# Patient Record
Sex: Female | Born: 1965 | Hispanic: Yes | Marital: Married | Smoking: Never smoker
Health system: Southern US, Community
[De-identification: ages and names within clinical notes are randomized; demographics above are authoritative.]

---

## 2012-12-31 ENCOUNTER — Encounter (HOSPITAL_COMMUNITY): Payer: Self-pay | Admitting: *Deleted

## 2012-12-31 ENCOUNTER — Emergency Department (HOSPITAL_COMMUNITY)
Admission: EM | Admit: 2012-12-31 | Discharge: 2013-01-01 | Disposition: A | Discharge status: Home / Self Care | Payer: Self-pay | Attending: Emergency Medicine | Admitting: Emergency Medicine

## 2012-12-31 DIAGNOSIS — Z79899 Other long term (current) drug therapy: Secondary | ICD-10-CM | POA: Insufficient documentation

## 2012-12-31 DIAGNOSIS — K1370 Unspecified lesions of oral mucosa: Secondary | ICD-10-CM | POA: Insufficient documentation

## 2012-12-31 DIAGNOSIS — K1379 Other lesions of oral mucosa: Secondary | ICD-10-CM

## 2012-12-31 MED ORDER — HYDROCODONE-ACETAMINOPHEN 7.5-325 MG/15ML PO SOLN: 15.0000 mL | Freq: Three times a day (TID) | ORAL | Status: AC | PRN

## 2012-12-31 NOTE — ED Provider Notes (Signed)
CSN: [id number]     Arrival date & time 12/31/12  2122 History   This chart was scribed for non-physician practitioner, Antony Madura, PA-C, working with Juliet Rude. Rubin Payor, MD by Leone Payor, ED Scribe. This patient was seen in room TR07C/TR07C and the patient's care was started at 2122.   None    Chief Complaint  Patient presents with  . Mouth Lesions    The history is provided by the patient and a relative. No language interpreter was used.   HPI Comments: Virginia Cook- Alta Corning is a 47 y.o. female who presents to the Emergency Department complaining of ongoing, constant, worsening nodules to the sides of tongue that initially started over 1 month ago. Pt states the bumps are painful and are described as burning. She reports having mild bleeding when the nodules first appeared but has since resolved. Pt states she was seen for similar symptoms at a clinic on 12/27/12 and was told it was fungus related and was prescribed fluconazole and nystatin mouthwash. Pt immunizations UTD. She denies recent travel abroad or sick contacts. Pt denies fever, ear pain, ear discharge, inability to swallow, drooling, and SOB.   History reviewed. No pertinent past medical history. No past surgical history on file. No family history on file.  History  Substance Use Topics  . Smoking status: Not on file  . Smokeless tobacco: Not on file  . Alcohol Use: Not on file   OB History   Grav Para Term Preterm Abortions TAB SAB Ect Mult Living                 Review of Systems  Constitutional: Negative for fever.  HENT: Positive for mouth sores. Negative for ear pain and ear discharge.   Respiratory: Negative for shortness of breath.   All other systems reviewed and are negative.   Allergies  Review of patient's allergies indicates no known allergies.  Home Medications   Current Outpatient Rx  Name  Route  Sig  Dispense  Refill  . ferrous sulfate 325 (65 FE) MG EC tablet   Oral   Take 325 mg  by mouth daily.         . fluconazole (DIFLUCAN) 100 MG tablet   Oral   Take 100 mg by mouth daily.         Marland Kitchen HYDROcodone-acetaminophen (HYCET) 7.5-325 mg/15 ml solution   Oral   Take 15 mLs by mouth every 8 (eight) hours as needed for pain.   120 mL   0    BP 141/72  Pulse 69  Temp(Src) 98 F (36.7 C) (Oral)  Resp 20  SpO2 100%  Physical Exam  Nursing note and vitals reviewed. Constitutional: She is oriented to person, place, and time. She appears well-developed and well-nourished. No distress.  HENT:  Head: Normocephalic and atraumatic.  Right Ear: Tympanic membrane, external ear and ear canal normal. No mastoid tenderness.  Left Ear: Tympanic membrane, external ear and ear canal normal. No mastoid tenderness.  Nose: Nose normal.  Mouth/Throat: Uvula is midline and mucous membranes are normal. No trismus in the jaw. No edematous. Posterior oropharyngeal erythema (mild) present. No oropharyngeal exudate.  Slight erythema to the posterior pharynx. No exudate noted. Airway patent. Uvula midline. No oral lesions or ulcerations appreciated.  Eyes: Conjunctivae and EOM are normal. No scleral icterus.  Neck: Normal range of motion.  Cardiovascular: Normal rate, regular rhythm and normal heart sounds.   Pulmonary/Chest: Effort normal and breath sounds normal. No stridor. No  respiratory distress. She has no wheezes. She has no rales.  Musculoskeletal: Normal range of motion.  Lymphadenopathy:    She has no cervical adenopathy.  Neurological: She is alert and oriented to person, place, and time.  Skin: Skin is warm and dry. No rash noted. She is not diaphoretic. No erythema. No pallor.  Psychiatric: She has a normal mood and affect. Her behavior is normal.   ED Course  DIAGNOSTIC STUDIES: Oxygen Saturation is 100% on RA, normal by my interpretation.    COORDINATION OF CARE: 11:04 PM-Discussed treatment plan with pt at bedside and pt agreed to plan.   Procedures (including  critical care time)  Labs Reviewed  RAPID STREP SCREEN  CULTURE, GROUP A STREP   No results found.  1. Pain in mouth    MDM  Uncomplicated mouth pain - patient as well and nontoxic appearing and afebrile; hemodynamically stable. Airway patent and patient tolerating secretions without difficulty or drooling. Uvula midline without evidence of PTA. No trismus or stridor appreciated. No exudates, lesions, or ulcerations appreciated. No findings c/w candidal infection. Rapid strep negative. Patient appropriate for d/c with ENT and dental follow up for further evaluation of symptoms. Indications for ED return provided. Patient seen also by Dr. Rubin Payor who is in agreement with this work up, assessment/managment plan, and patient's stability for d/c.  I personally performed the services described in this documentation, which was scribed in my presence. The recorded information has been reviewed and is accurate.    Antony Madura, PA-C 01/01/13 0005

## 2012-12-31 NOTE — ED Notes (Signed)
Pt. Is being followed by [facility] for bumps on back of her tongue. She was given flagyl and a mouth wash and was told to come back to them if she still had trouble and they would do more testing. She was told at that time she had a fungus. She present now with pain. She has not completed he prescription yet.

## 2012-12-31 NOTE — ED Notes (Signed)
Pt in c/o bumps on her tongue for over a month, seen for same and told she had a fungus, in today due to symptoms continuing

## 2013-01-01 NOTE — ED Provider Notes (Signed)
Medical screening examination/treatment/procedure(s) were conducted as a shared visit with non-physician practitioner(s) and myself.  I personally evaluated the patient during the encounter. Patient with pain in her mouth. No clear lesions. Will discharge with followup  Virginia Cook. Virginia Payor, MD 01/01/13 1454

## 2017-03-24 ENCOUNTER — Ambulatory Visit: Payer: Self-pay

## 2017-05-12 ENCOUNTER — Ambulatory Visit: Payer: Self-pay | Attending: Oncology

## 2019-11-28 ENCOUNTER — Ambulatory Visit: Payer: Self-pay

## 2019-12-06 ENCOUNTER — Ambulatory Visit: Payer: Self-pay | Attending: Oncology | Admitting: *Deleted

## 2019-12-06 ENCOUNTER — Encounter: Payer: Self-pay | Admitting: *Deleted

## 2019-12-06 ENCOUNTER — Other Ambulatory Visit: Payer: Self-pay

## 2019-12-06 ENCOUNTER — Encounter (INDEPENDENT_AMBULATORY_CARE_PROVIDER_SITE_OTHER): Payer: Self-pay

## 2019-12-06 ENCOUNTER — Ambulatory Visit
Admission: RE | Admit: 2019-12-06 | Discharge: 2019-12-06 | Disposition: A | Discharge status: Home / Self Care | Payer: Self-pay | Source: Ambulatory Visit | Attending: Oncology | Admitting: Oncology

## 2019-12-06 VITALS — BP 138/71 | HR 63 | Temp 97.9°F | Ht 58.25 in | Wt 150.6 lb

## 2019-12-06 DIAGNOSIS — Z0000 Encounter for general adult medical examination without abnormal findings: Secondary | ICD-10-CM | POA: Insufficient documentation

## 2019-12-06 NOTE — Patient Instructions (Signed)
Gave patient hand-out, Women Staying Healthy, Active and Well from [facility], with education on breast health, pap smears, heart and colon health. 

## 2019-12-06 NOTE — Progress Notes (Signed)
  Subjective:     Patient ID: Virginia Cook, female   DOB: [birth year], 54 y.o.   MRN: [id number]  HPI  [facility] Medical History Record - 12/06/19 1020      Breast History   Screening cycle New    CBE Date --   unknown   Initial Mammogram 12/06/19    Last Mammogram Annual    Last Mammogram Date 05/21/09    Provider (Mammogram)  [facility]    Recent Breast Symptoms None      Breast Cancer History   Breast Cancer History No personal or family history      Previous History of Breast Problems   Breast Surgery or Biopsy None    Breast Implants N/A    BSE Done Monthly      Gynecological/Obstetrical History   Is there any chance that the client could be pregnant?  No    Age at menarche 36    Age at menopause 2    PAP smear history See comments    Date of last PAP  02/06/19    Provider (PAP) Bernestine Amass    Age at first live birth 74    Breast fed children Yes (type length in comments)   70yr 28mo   DES Exposure Unkown    Cervical, Uterine or Ovarian cancer No    Family history of Cervial, Uterine or Ovarian cancer No    Hysterectomy No    Cervix removed No    Ovaries removed No    Laser/Cryosurgery No    Current method of birth control None    Current method of Estrogen/Hormone replacement None    Smoking history None           Review of Systems     Objective:   Physical Exam Chest:     Breasts:        Right: No swelling, bleeding, inverted nipple, mass, nipple discharge, skin change or tenderness.        Left: No swelling, bleeding, inverted nipple, mass, nipple discharge, skin change or tenderness.  Lymphadenopathy:     Upper Body:     Right upper body: No supraclavicular or axillary adenopathy.     Left upper body: No supraclavicular or axillary adenopathy.        Assessment:     54 year old Hispanic female presents for clinical breast exam and mammogram only.  Virginia Cook, the interpreter present during the interview and exam.  Clinical breast exam  unremarkable.  Taught self breast awareness.  Last pap was in 2020 at [facility].  She did just have a negative endometrial biopsy at [facility] on 11/17/19.  Patient has been screened for eligibility.  She does not have any insurance, Medicare or Medicaid.  She also meets financial eligibility.   Risk Assessment    Risk Scores      12/06/2019   Last edited by: Alta Corning, CMA   5-year risk: 0.5 %   Lifetime risk: 4 %            Plan:     Screening mammogram ordered.  Will follow up per [facility] protocol.

## 2019-12-12 ENCOUNTER — Other Ambulatory Visit: Payer: Self-pay | Admitting: *Deleted

## 2019-12-12 DIAGNOSIS — N63 Unspecified lump in unspecified breast: Secondary | ICD-10-CM

## 2019-12-13 ENCOUNTER — Telehealth: Payer: Self-pay | Admitting: *Deleted

## 2019-12-13 ENCOUNTER — Encounter: Payer: Self-pay | Admitting: *Deleted

## 2019-12-13 NOTE — Telephone Encounter (Signed)
CALLED PT TO [facility] - NO ANSWER NO VM - SENDING 2ND RESULT LETTER.

## 2019-12-28 ENCOUNTER — Ambulatory Visit
Admission: RE | Admit: 2019-12-28 | Discharge: 2019-12-28 | Disposition: A | Discharge status: Home / Self Care | Payer: Self-pay | Source: Ambulatory Visit | Attending: Oncology | Admitting: Oncology

## 2019-12-28 ENCOUNTER — Other Ambulatory Visit: Payer: Self-pay

## 2019-12-28 DIAGNOSIS — N63 Unspecified lump in unspecified breast: Secondary | ICD-10-CM | POA: Insufficient documentation

## 2020-01-04 ENCOUNTER — Other Ambulatory Visit: Payer: Self-pay | Admitting: *Deleted

## 2020-01-04 ENCOUNTER — Encounter: Payer: Self-pay | Admitting: *Deleted

## 2020-01-04 DIAGNOSIS — N63 Unspecified lump in unspecified breast: Secondary | ICD-10-CM

## 2020-01-04 NOTE — Progress Notes (Signed)
Letter mailed to inform patient of her 6 month follow up mammogram on 06/21/20 @ 10:20.

## 2020-06-21 ENCOUNTER — Ambulatory Visit: Payer: Self-pay

## 2020-12-10 ENCOUNTER — Encounter: Payer: Self-pay | Admitting: Student

## 2020-12-10 ENCOUNTER — Encounter: Payer: Self-pay | Admitting: Family Medicine

## 2020-12-17 ENCOUNTER — Ambulatory Visit
Admission: RE | Admit: 2020-12-17 | Discharge: 2020-12-17 | Disposition: A | Discharge status: Home / Self Care | Payer: Self-pay | Source: Ambulatory Visit | Attending: Oncology | Admitting: Oncology

## 2020-12-17 ENCOUNTER — Other Ambulatory Visit: Payer: Self-pay

## 2020-12-17 ENCOUNTER — Ambulatory Visit: Payer: Self-pay | Attending: Oncology | Admitting: *Deleted

## 2020-12-17 VITALS — BP 122/61 | HR 58 | Temp 97.2°F | Ht 59.0 in | Wt 144.6 lb

## 2020-12-17 DIAGNOSIS — N63 Unspecified lump in unspecified breast: Secondary | ICD-10-CM

## 2020-12-17 NOTE — Progress Notes (Signed)
m Subjective:     Patient ID: Virginia Cook, female   DOB: 1965/12/09, 55 y.o.   MRN: [id number]  HPI  [facility] Medical History Record - 12/17/20 9937       Breast History   Screening cycle Rescreen    CBE Date 12/27/19    Provider (CBE) [facility]    Initial Mammogram 12/17/20    Last Mammogram Date 12/28/19    Provider (Mammogram)  [facility]    Recent Breast Symptoms Pain      Breast Cancer History   Breast Cancer History No personal or family history      Previous History of Breast Problems   Breast Surgery or Biopsy None    Breast Implants N/A    BSE Done Monthly      Gynecological/Obstetrical History   LMP --   5-6 years ago   Is there any chance that the client could be pregnant?  No    Age at menarche 55    Age at menopause 55    PAP smear history Annually    Date of last PAP  05/18/19    Provider (PAP) [facility]    Age at first live birth 13    Breast fed children Yes (type length in comments)   1 year   DES Exposure Unkown    Cervical, Uterine or Ovarian cancer No    Family history of Cervial, Uterine or Ovarian cancer No    Hysterectomy No    Ovaries removed No    Laser/Cryosurgery No    Current method of birth control None    Current method of Estrogen/Hormone replacement None    Smoking history None               Review of Systems     Objective:   Physical Exam Chest:  Breasts:    Right: No swelling, bleeding, inverted nipple, mass, nipple discharge, skin change, tenderness, axillary adenopathy or supraclavicular adenopathy.     Left: No swelling, bleeding, inverted nipple, mass, nipple discharge, skin change, tenderness, axillary adenopathy or supraclavicular adenopathy.  Lymphadenopathy:     Upper Body:     Right upper body: No supraclavicular or axillary adenopathy.     Left upper body: No supraclavicular or axillary adenopathy.       Assessment:     55 year old Hispanic female returns to [facility] for annual screening.   Virginia Cook the Interpreter present during the interview and exam.  Patients last mammogram on 12/28/19 was a birads 3.  Patient did not return for her 6 month follow up mammogram.  On clinical breast exam there is no dominant mass, skin changes, nipple discharge or lymphadenopathy.  Taught self breast awareness.  Last pap on 05/18/19 was negative without HPV co-testing.  Next pap due in 2024.  Patient has been screened for eligibility.  She does not have any insurance, Medicare or Medicaid.  She also meets financial eligibility.   Risk Assessment     Risk Scores       12/17/2020 12/06/2019   Last edited by: Jim Like, RN [place], Freada Bergeron, CMA   5-year risk: 0.5 % 0.5 %   Lifetime risk: 3.9 % 4 %               Plan:     Will order bilateral diagnostic mammogram and ultrasound.  Will follow up per [facility] protocol.

## 2020-12-18 ENCOUNTER — Encounter: Payer: Self-pay | Admitting: *Deleted

## 2020-12-18 NOTE — Patient Instructions (Signed)
Gave patient hand-out, Women Staying Healthy, Active and Well from [facility], with education on breast health, pap smears, heart and colon health. 

## 2020-12-18 NOTE — Progress Notes (Signed)
Letter mailed from [facility] to inform patient of her normal mammogram results.  Patient is to follow-up with annual screening in one year. 

## 2022-01-29 ENCOUNTER — Other Ambulatory Visit: Payer: Self-pay

## 2022-01-29 DIAGNOSIS — Z1231 Encounter for screening mammogram for malignant neoplasm of breast: Secondary | ICD-10-CM

## 2022-02-02 ENCOUNTER — Ambulatory Visit: Payer: Self-pay | Attending: Hematology and Oncology | Admitting: Hematology and Oncology

## 2022-02-02 ENCOUNTER — Ambulatory Visit
Admission: RE | Admit: 2022-02-02 | Discharge: 2022-02-02 | Disposition: A | Discharge status: Home / Self Care | Payer: Self-pay | Source: Ambulatory Visit | Attending: Obstetrics and Gynecology | Admitting: Obstetrics and Gynecology

## 2022-02-02 VITALS — BP 127/69 | Wt 148.4 lb

## 2022-02-02 DIAGNOSIS — Z1231 Encounter for screening mammogram for malignant neoplasm of breast: Secondary | ICD-10-CM

## 2022-02-02 NOTE — Progress Notes (Signed)
Virginia Cook is a 56 y.o. female who presents to [facility] clinic today with no complaints .    Pap Smear: Pap not smear completed today. Last Pap smear was 05/18/2019 at [facility] clinic and was normal. Per patient has no history of an abnormal Pap smear. Last Pap smear result is not available in Epic.   Physical exam: Breasts Breasts symmetrical. No skin abnormalities bilateral breasts. No nipple retraction bilateral breasts. No nipple discharge bilateral breasts. No lymphadenopathy. No lumps palpated bilateral breasts.    MS DIGITAL DIAG TOMO BILAT  Result Date: 12/17/2020 CLINICAL DATA:  Short-term follow-up left breast asymmetry EXAM: DIGITAL DIAGNOSTIC BILATERAL MAMMOGRAM WITH TOMOSYNTHESIS AND CAD TECHNIQUE: Bilateral digital diagnostic mammography and breast tomosynthesis was performed. The images were evaluated with computer-aided detection. COMPARISON:  Prior films ACR Breast Density Category b: There are scattered areas of fibroglandular density. FINDINGS: Cc and MLO views of bilateral breasts are submitted. Previously noted asymmetry in the posteromedial left breast appears to have focal notched fat and is unchanged compared prior exam. No suspicious abnormalities identified bilaterally. IMPRESSION: Benign findings. RECOMMENDATION: Routine screening mammogram in 1 year. I have discussed the findings and recommendations with the patient. If applicable, a reminder letter will be sent to the patient regarding the next appointment. BI-RADS CATEGORY  2: Benign. Electronically Signed   By: Sherian Rein M.D.   On: 12/17/2020 09:53  MS DIGITAL DIAG TOMO BILAT  Result Date: 12/28/2019 CLINICAL DATA:  56 year old patient recalled from recent baseline screening mammogram for evaluation possible mass in the left breast and a possible mass in the right breast. Spanish interpreter present throughout the exam. EXAM: DIGITAL DIAGNOSTIC BILATERAL MAMMOGRAM WITH CAD AND TOMO ULTRASOUND  BILATERAL BREASTS COMPARISON:  December 06, 2019 ACR Breast Density Category b: There are scattered areas of fibroglandular density. FINDINGS: Spot compression views confirm a 6 mm oval mass or possibly focal fibroglandular tissue in the posterior third of the medial left breast, near the level of the nipple. Spot compression views of the 6 o'clock retroareolar right breast show a 5 mm oval mass versus retroareolar duct. Mammographic images were processed with CAD. Targeted ultrasound is performed, showing mildly dilated duct versus focal cyst in the 6 o'clock retroareolar right breast with some internal debris. No associated vascular flow. Measures 6 x 4 x 3 mm and corresponds to the mammographic finding. No suspicious findings on ultrasound of the right breast. Ultrasound of the medial left breast, including lower inner and lower outer quadrants is performed by the sonographer and by myself. No suspicious findings are identified on ultrasound and no definite sonographic correlate to the possible mass identified on mammography. IMPRESSION: Probably benign mass versus asymmetry in the posterior third of the medial left breast, with no sonographic correlate. Benign retroareolar duct/cyst in the 6 o'clock axis of the right breast. No evidence of malignancy in the right. RECOMMENDATION: Diagnostic left mammogram and possible left breast ultrasound recommended in 6 months. I have discussed the findings and recommendations with the patient. If applicable, a reminder letter will be sent to the patient regarding the next appointment. BI-RADS CATEGORY  3: Probably benign. Electronically Signed   By: Britta Mccreedy M.D.   On: 12/28/2019 11:52   MS DIGITAL SCREENING TOMO BILATERAL  Result Date: 12/11/2019 CLINICAL DATA:  Screening. EXAM: DIGITAL SCREENING BILATERAL MAMMOGRAM WITH TOMO AND CAD COMPARISON:  None. ACR Breast Density Category b: There are scattered areas of fibroglandular density. FINDINGS: In the right breast a  possible mass  requires further evaluation. In the left breast a possible mass requires further evaluation. Images were processed with CAD. IMPRESSION: Further evaluation is suggested for possible mass in the right breast. Further evaluation is suggested for possible mass in the left breast. RECOMMENDATION: Diagnostic mammogram and possibly ultrasound of both breasts. (Code:[id number]) The patient will be contacted regarding the findings, and additional imaging will be scheduled. BI-RADS CATEGORY  0: Incomplete. Need additional imaging evaluation and/or prior mammograms for comparison. Electronically Signed   By: Nolon Nations M.D.   On: 12/11/2019 15:21       Pelvic/Bimanual Pap is not indicated today    Smoking History: Patient has never smoked and was not referred to quit line.    Patient Navigation: Patient education provided. Access to services provided for patient through [place] interpreter provided. No transportation provided   Colorectal Cancer Screening: Per patient has had colonoscopy completed on 09/24/2020 with results revealing fragments of tubular adenoma.   No complaints today.    Breast and Cervical Cancer Risk Assessment: Patient does not have family history of breast cancer, known genetic mutations, or radiation treatment to the chest before age 56. Patient does not have history of cervical dysplasia, immunocompromised, or DES exposure in-utero.  Risk Assessment   No risk assessment data for the current encounter  Risk Scores       12/17/2020   Last edited by: Rico Junker, RN   5-year risk: 0.5 %   Lifetime risk: 3.9 %            A: [facility] exam without pap smear No complaints and benign exam.   P: [facility]. Appointment scheduled 02/02/2022.  Dayton Scrape A, NP 02/02/2022 10:08 AM

## 2022-08-12 NOTE — Progress Notes (Signed)
Per [facility], Provider in process of reaching out to patient to have patient scheduled for CPE with pap.

## 2022-09-07 ENCOUNTER — Other Ambulatory Visit: Payer: Self-pay

## 2022-09-07 ENCOUNTER — Ambulatory Visit: Payer: Self-pay | Attending: Hematology and Oncology | Admitting: Hematology and Oncology

## 2022-09-07 DIAGNOSIS — Z124 Encounter for screening for malignant neoplasm of cervix: Secondary | ICD-10-CM

## 2022-09-07 NOTE — Progress Notes (Signed)
[facility]           Date of Birth: [birth year]           MRN: [id number] Visit Date: 09/07/2022 PCP: Rosemarie Beath, PA-C (Inactive)     Cervical Exam Pap smear completed: Pap test Abnormal Observations: Normal exam. Recommendations: Repeat Pap smear in 5 years if normal and negative HPV.    Patient's History There are no problems to display for this patient.  No past medical history on file.  Family History  Problem Relation Age of Onset  . Breast cancer Neg Hx     Social History   Occupational History  . Not on file  Tobacco Use  . Smoking status: Never  . Smokeless tobacco: Never  Vaping Use  . Vaping Use: Never used  Substance and Sexual Activity  . Alcohol use: Not Currently  . Drug use: Never  . Sexual activity: Yes    Birth control/protection: Post-menopausal

## 2022-09-09 ENCOUNTER — Telehealth: Payer: Self-pay

## 2022-09-09 LAB — CYTOLOGY - PAP
Comment: NEGATIVE
Diagnosis: NEGATIVE
High risk HPV: NEGATIVE

## 2022-09-09 NOTE — Telephone Encounter (Signed)
Via Delorise Royals, Spanish Interpreter [facility]), left message on voicemail requesting a return call. Attempted to contact patient regarding lab results.

## 2022-09-11 ENCOUNTER — Telehealth: Payer: Self-pay

## 2022-09-11 NOTE — Telephone Encounter (Signed)
Via Delorise Royals, Spanish Interpreter [facility]), Patient informed negative Pap/HPV results, next pap smear due in 5 years, verbalized understanding.

## 2023-05-24 IMAGING — MG DIGITAL DIAGNOSTIC BILAT W/ TOMO W/ CAD
8 series · 8 of 24 positions shown · non-contrast
Comparison: Prior films

CLINICAL DATA: Short-term follow-up left breast asymmetry

EXAM:
DIGITAL DIAGNOSTIC BILATERAL MAMMOGRAM WITH TOMOSYNTHESIS AND CAD
TECHNIQUE: Bilateral digital diagnostic mammography and breast tomosynthesis
was performed. The images were evaluated with computer-aided
detection.

[L CC synth-2D]
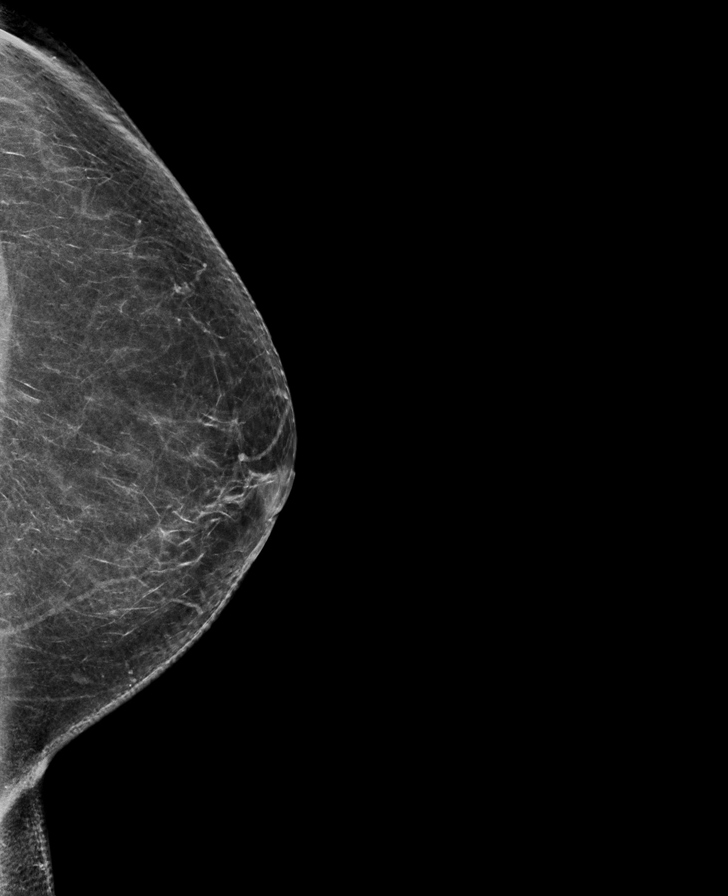

[R MLO synth-2D]
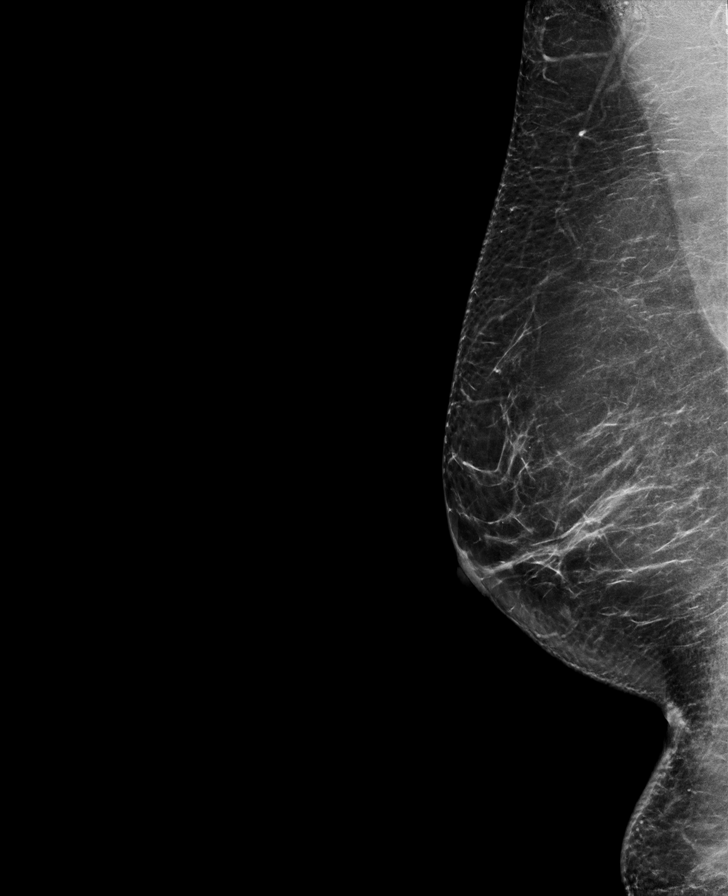

[L MLO synth-2D]
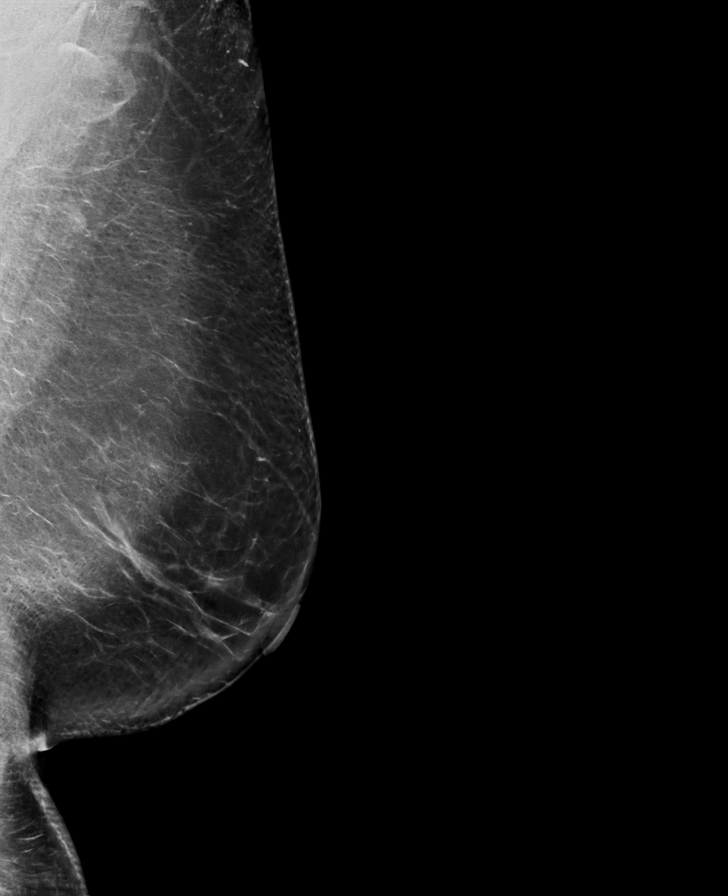

[R CC synth-2D]
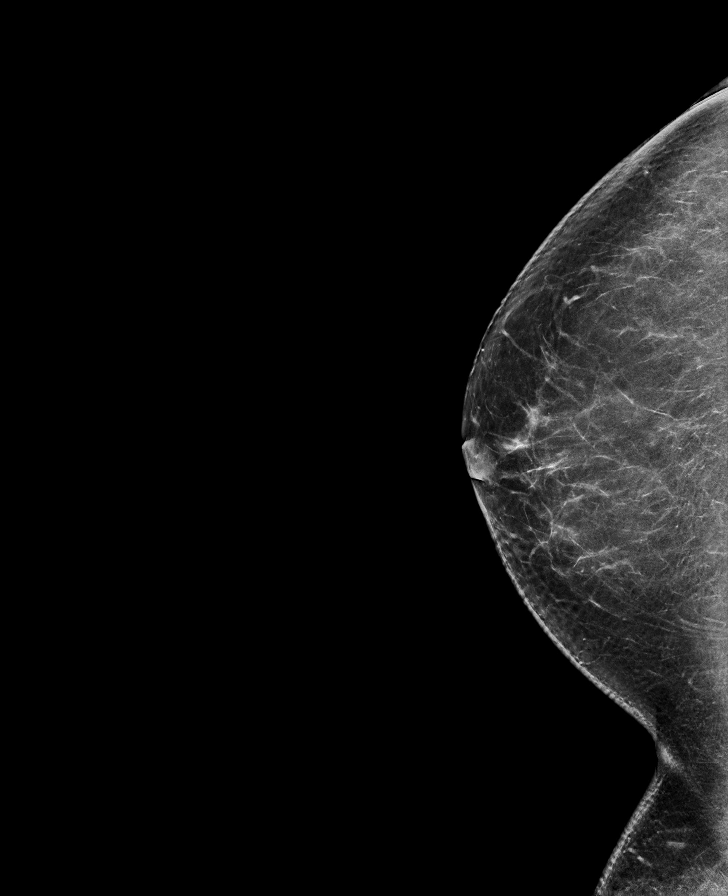

[R CC tomo · tomo slice 39/77.0]
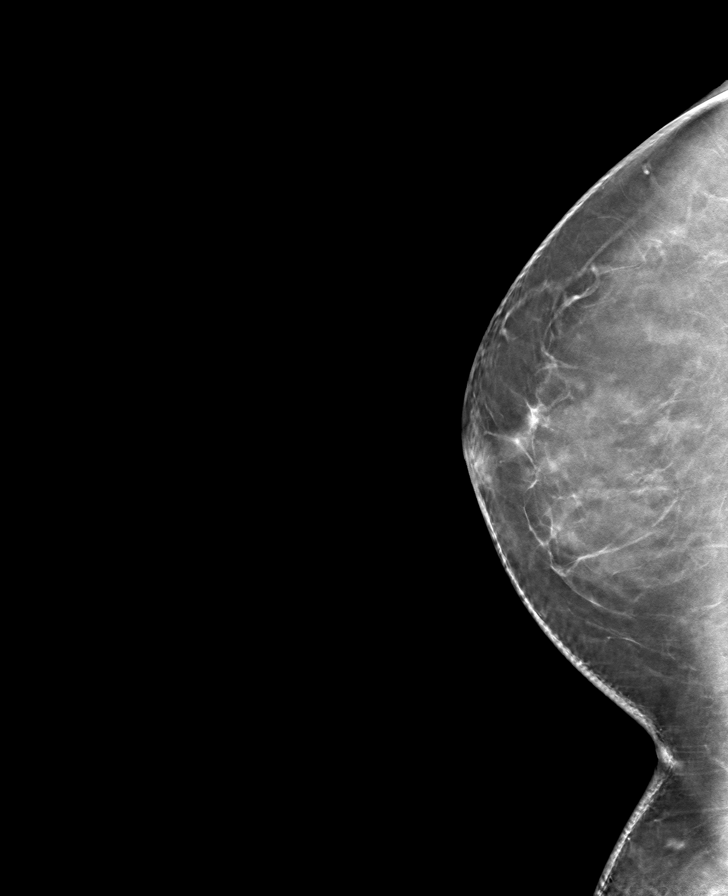

[L MLO tomo · tomo slice 46/91.0]
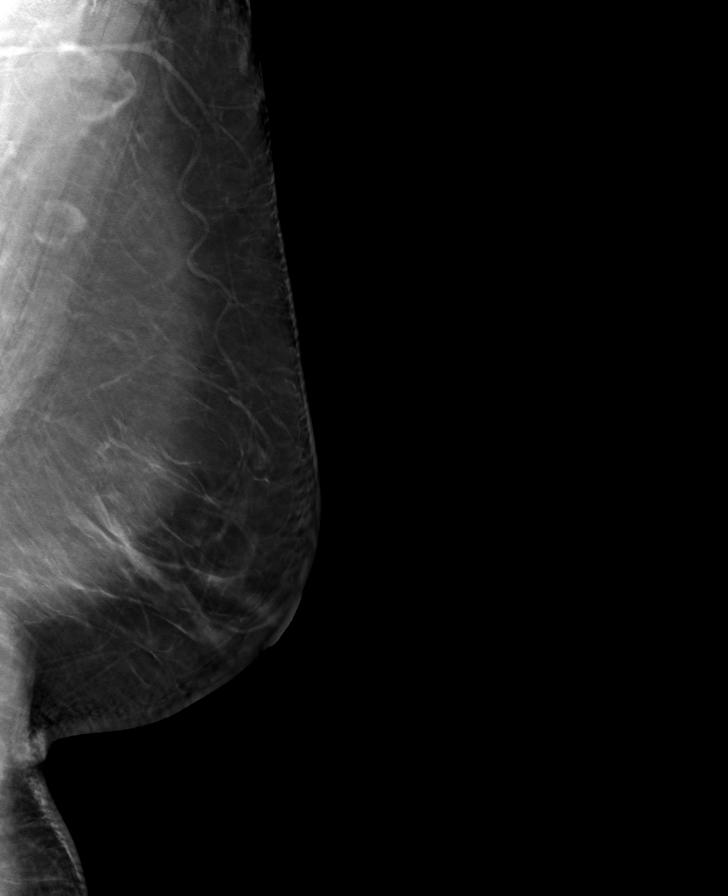

[R MLO tomo · tomo slice 36/71.0]
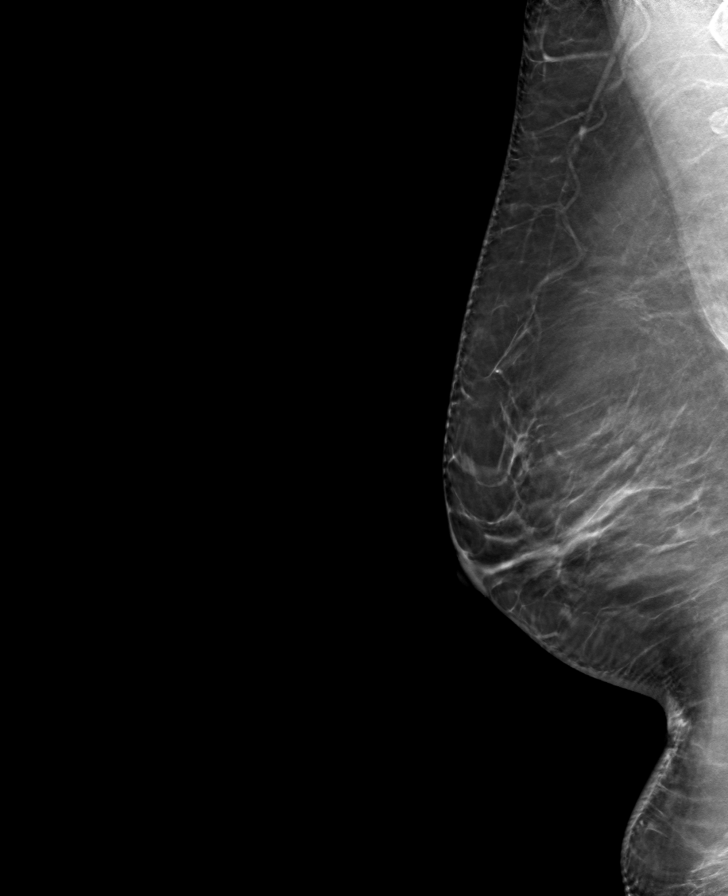

[L CC tomo · tomo slice 39/77.0]
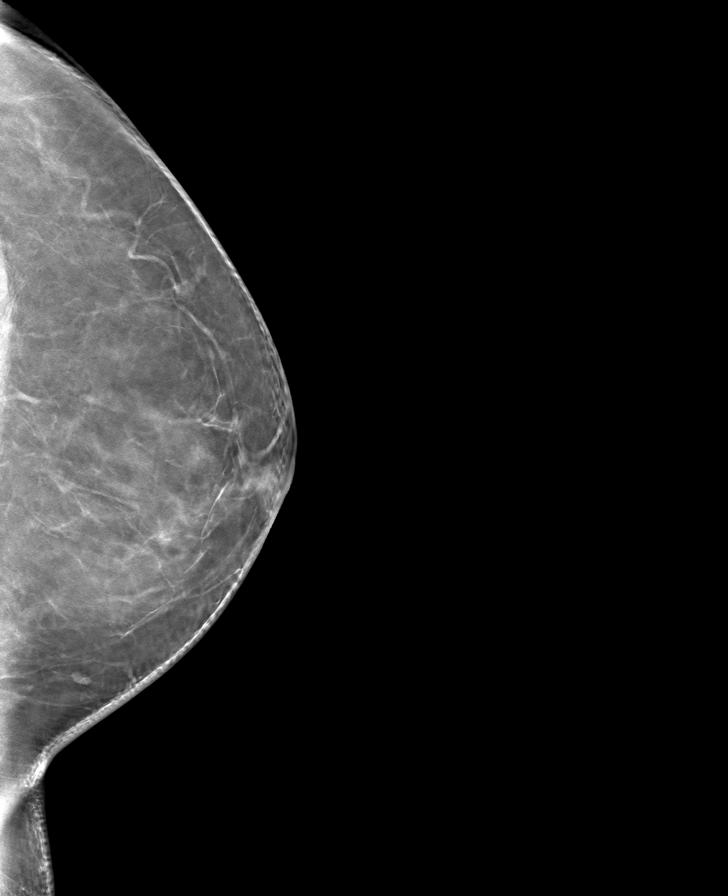

[8 of 24 positions shown; findings below may reference images not displayed]

ACR Breast Density Category b: There are scattered areas of
fibroglandular density.
FINDINGS: Cc and MLO views of bilateral breasts are submitted. Previously
noted asymmetry in the posteromedial left breast appears to have
focal notched fat and is unchanged compared prior exam. No
suspicious abnormalities identified bilaterally.
IMPRESSION: Benign findings.

RECOMMENDATION:
Routine screening mammogram in 1 year.

I have discussed the findings and recommendations with the patient.
If applicable, a reminder letter will be sent to the patient
regarding the next appointment.

BI-RADS CATEGORY  2: Benign.

## 2023-11-15 ENCOUNTER — Telehealth: Payer: Self-pay | Admitting: *Deleted

## 2023-12-03 ENCOUNTER — Other Ambulatory Visit: Payer: Self-pay | Admitting: Physician Assistant

## 2023-12-03 DIAGNOSIS — Z1231 Encounter for screening mammogram for malignant neoplasm of breast: Secondary | ICD-10-CM

## 2023-12-22 ENCOUNTER — Ambulatory Visit
Admission: RE | Admit: 2023-12-22 | Discharge: 2023-12-22 | Disposition: A | Discharge status: Home / Self Care | Payer: Self-pay | Source: Ambulatory Visit | Attending: Physician Assistant | Admitting: Physician Assistant

## 2023-12-22 DIAGNOSIS — Z1231 Encounter for screening mammogram for malignant neoplasm of breast: Secondary | ICD-10-CM | POA: Insufficient documentation
